# Patient Record
Sex: Male | Born: 1995 | Race: White | Hispanic: No | Marital: Single | Smoking: Never smoker
Health system: Southern US, Community
[De-identification: ages and names within clinical notes are randomized; demographics above are authoritative.]

## PROBLEM LIST (undated history)

## (undated) DIAGNOSIS — J45909 Unspecified asthma, uncomplicated: Secondary | ICD-10-CM

---

## 2017-03-21 ENCOUNTER — Encounter (HOSPITAL_BASED_OUTPATIENT_CLINIC_OR_DEPARTMENT_OTHER): Payer: Self-pay

## 2017-03-21 ENCOUNTER — Emergency Department (HOSPITAL_BASED_OUTPATIENT_CLINIC_OR_DEPARTMENT_OTHER)
Admission: EM | Admit: 2017-03-21 | Discharge: 2017-03-21 | Disposition: A | Discharge status: Home / Self Care | Payer: BLUE CROSS/BLUE SHIELD | Attending: Emergency Medicine | Admitting: Emergency Medicine

## 2017-03-21 ENCOUNTER — Emergency Department (HOSPITAL_BASED_OUTPATIENT_CLINIC_OR_DEPARTMENT_OTHER): Payer: BLUE CROSS/BLUE SHIELD

## 2017-03-21 ENCOUNTER — Other Ambulatory Visit: Payer: Self-pay

## 2017-03-21 DIAGNOSIS — R51 Headache: Secondary | ICD-10-CM | POA: Insufficient documentation

## 2017-03-21 DIAGNOSIS — R519 Headache, unspecified: Secondary | ICD-10-CM

## 2017-03-21 HISTORY — DX: Unspecified asthma, uncomplicated: J45.909

## 2017-03-21 LAB — CBC WITH DIFFERENTIAL/PLATELET
BASOS ABS: 0 10*3/uL (ref 0.0–0.1)
Basophils Relative: 0 %
Eosinophils Absolute: 0.6 10*3/uL (ref 0.0–0.7)
Eosinophils Relative: 7 %
HEMATOCRIT: 46.4 % (ref 39.0–52.0)
HEMOGLOBIN: 15.8 g/dL (ref 13.0–17.0)
LYMPHS ABS: 2.9 10*3/uL (ref 0.7–4.0)
LYMPHS PCT: 32 %
MCH: 28.4 pg (ref 26.0–34.0)
MCHC: 34.1 g/dL (ref 30.0–36.0)
MCV: 83.3 fL (ref 78.0–100.0)
Monocytes Absolute: 0.7 10*3/uL (ref 0.1–1.0)
Monocytes Relative: 8 %
NEUTROS ABS: 4.9 10*3/uL (ref 1.7–7.7)
Neutrophils Relative %: 53 %
Platelets: 304 10*3/uL (ref 150–400)
RBC: 5.57 MIL/uL (ref 4.22–5.81)
RDW: 13.4 % (ref 11.5–15.5)
WBC: 9.1 10*3/uL (ref 4.0–10.5)

## 2017-03-21 LAB — BASIC METABOLIC PANEL
ANION GAP: 10 (ref 5–15)
BUN: 14 mg/dL (ref 6–20)
CHLORIDE: 102 mmol/L (ref 101–111)
CO2: 25 mmol/L (ref 22–32)
Calcium: 9.3 mg/dL (ref 8.9–10.3)
Creatinine, Ser: 1.28 mg/dL — ABNORMAL HIGH (ref 0.61–1.24)
GFR calc Af Amer: >60 mL/min (ref >60)
GFR calc non Af Amer: >60 mL/min (ref >60)
GLUCOSE: 111 mg/dL — AB (ref 65–99)
POTASSIUM: 3.4 mmol/L — AB (ref 3.5–5.1)
Sodium: 137 mmol/L (ref 135–145)

## 2017-03-21 MED ORDER — KETOROLAC TROMETHAMINE 30 MG/ML IJ SOLN
30.0000 mg | Freq: Once | INTRAMUSCULAR | Status: AC
Start: 2017-03-21 — End: 2017-03-21
  Administered 2017-03-21: 30 mg via INTRAVENOUS
  Filled 2017-03-21: qty 1

## 2017-03-21 MED ORDER — IOPAMIDOL (ISOVUE-370) INJECTION 76%
100.0000 mL | Freq: Once | INTRAVENOUS | Status: AC | PRN
  Administered 2017-03-21: 80 mL via INTRAVENOUS

## 2017-03-21 MED ORDER — PROCHLORPERAZINE EDISYLATE 5 MG/ML IJ SOLN
10.0000 mg | Freq: Once | INTRAMUSCULAR | Status: AC
  Administered 2017-03-21: 10 mg via INTRAVENOUS
  Filled 2017-03-21: qty 2

## 2017-03-21 MED ORDER — DEXAMETHASONE SODIUM PHOSPHATE 10 MG/ML IJ SOLN
10.0000 mg | Freq: Once | INTRAMUSCULAR | Status: AC
Start: 2017-03-21 — End: 2017-03-21
  Administered 2017-03-21: 10 mg via INTRAVENOUS
  Filled 2017-03-21: qty 1

## 2017-03-21 MED ORDER — SODIUM CHLORIDE 0.9 % IV BOLUS (SEPSIS)
1000.0000 mL | Freq: Once | INTRAVENOUS | Status: AC
  Administered 2017-03-21: 1000 mL via INTRAVENOUS

## 2017-03-21 MED ORDER — DIPHENHYDRAMINE HCL 25 MG PO CAPS
25.0000 mg | ORAL_CAPSULE | Freq: Once | ORAL | Status: AC
  Administered 2017-03-21: 25 mg via ORAL
  Filled 2017-03-21: qty 1

## 2017-03-21 NOTE — ED Provider Notes (Signed)
I received this patient in signout from Dr. Blinda LeatherwoodPollina.  We were awaiting head imaging and improvement.  CT angios head was negative for acute process.  On reassessment, patient was sitting up and comfortable, stated that his headache had improved to 3 or 4 out of 10.  He felt comfortable going home.  Discussed supportive measures and extensively reviewed return precautions.   Joshua Webster, Ambrose Finlandachel Morgan, MD 03/21/17 [phone]
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
## 2017-03-21 NOTE — ED Triage Notes (Signed)
Pt reports sharp pain to the back of his head last night. Pt denies N/V, blurred vision, dizziness and hx of migraines.

## 2018-08-05 IMAGING — CT CT ANGIO HEAD
3 of 11 series · 16 of 47 positions shown · IV contrast (APPLIED)
Comparison: None.

CLINICAL DATA: Sharp posterior headache several days ago and again
last night. Headache, acute, severe, worst headache of life.

EXAM:
CT ANGIOGRAPHY HEAD
TECHNIQUE: Multidetector CT imaging of the head was performed using the
standard protocol during bolus administration of intravenous
contrast. Multiplanar CT image reconstructions and MIPs were
obtained to evaluate the vascular anatomy.
CONTRAST:  80mL BGXVL7-BMR IOPAMIDOL (BGXVL7-BMR) INJECTION 76%

[Series 15: ax thin · axial · 0.39mm/px · z∈[-193,-57]mm · 10 of 168 slices shown]
[im 16/168  brain]
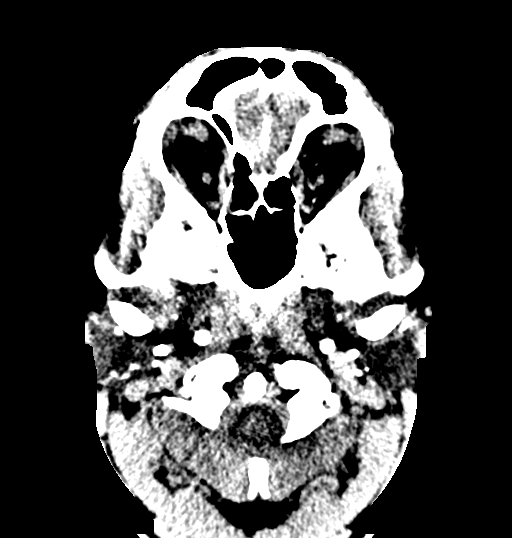
[im 31/168  bone]
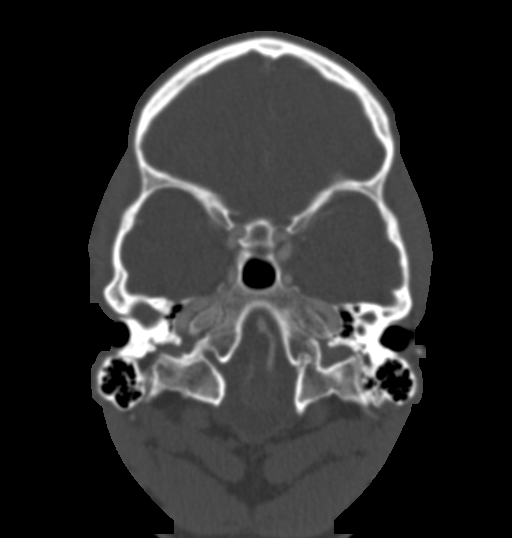
[im 46/168  brain]
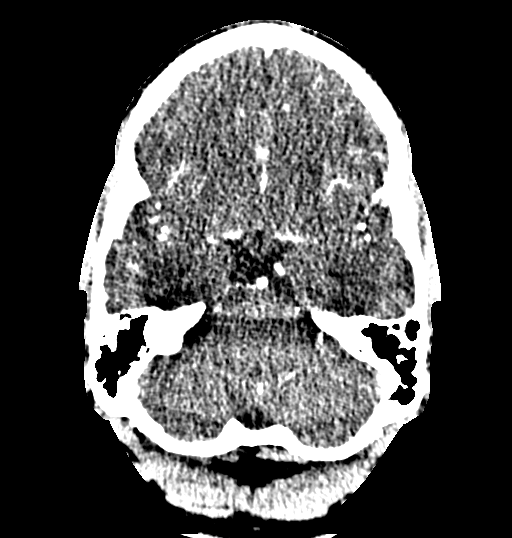
[im 61/168  bone]
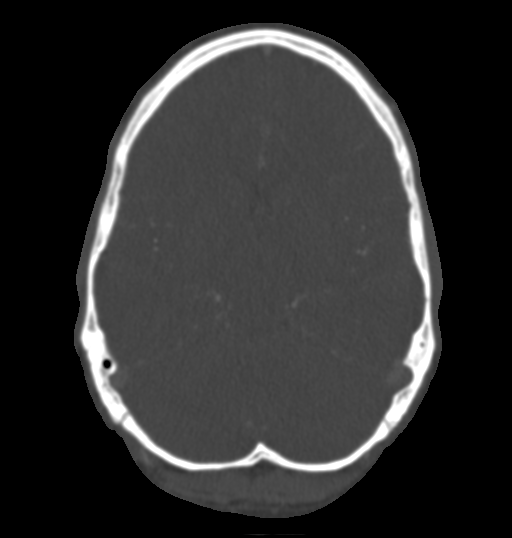
[im 76/168  brain]
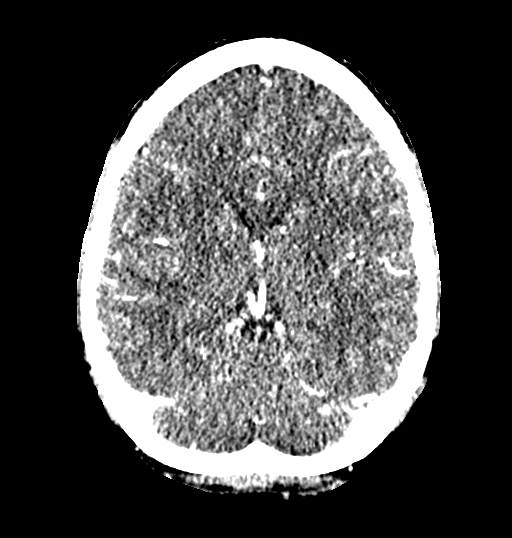
[im 92/168  bone]
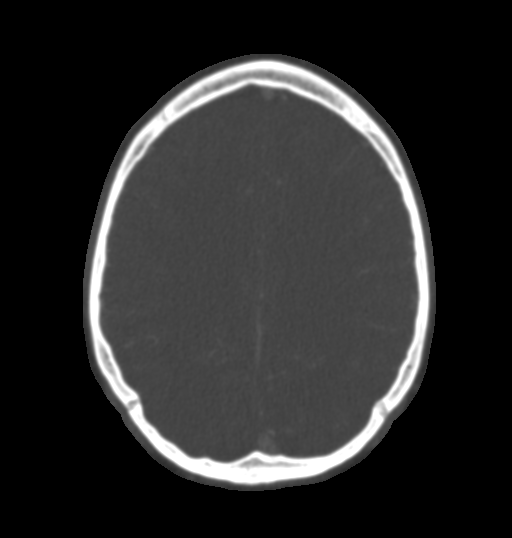
[im 107/168  brain]
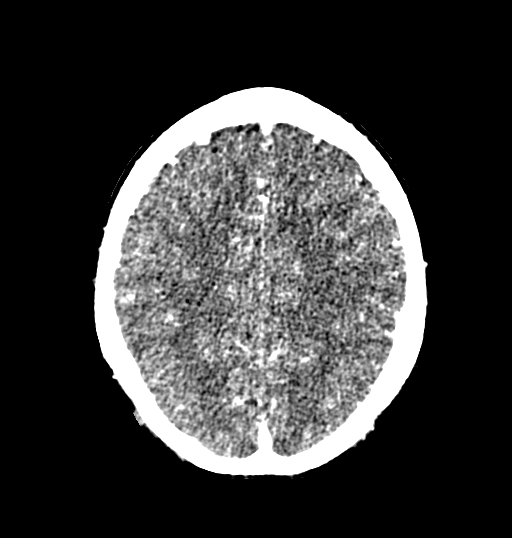
[im 122/168  bone]
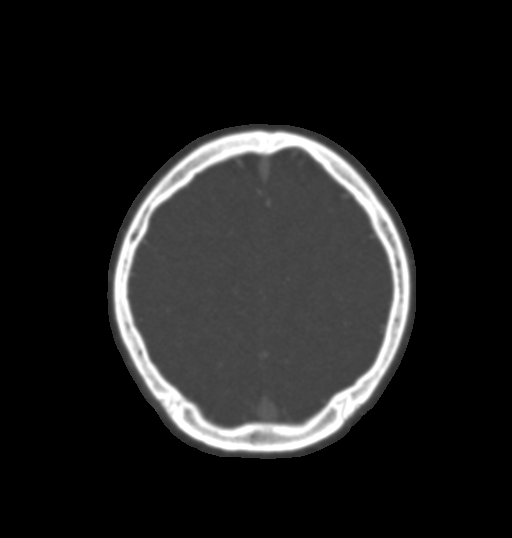
[im 137/168  brain]
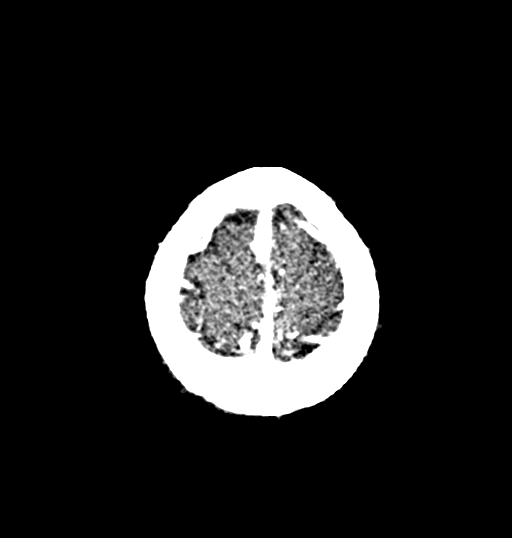
[im 152/168  bone]
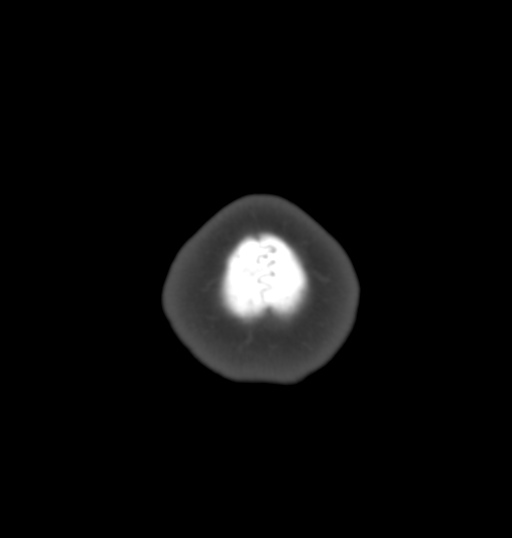

[Series 17: cor thin · coronal · 0.34mm/px · 3 of 209 slices shown]
[im 60/209  brain]
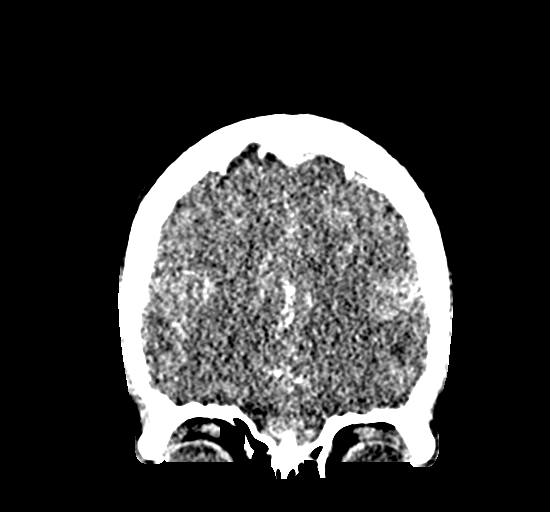
[im 90/209  brain]
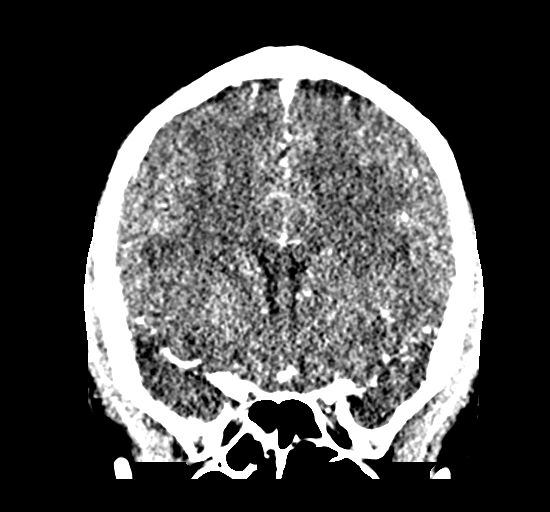
[im 119/209  brain]
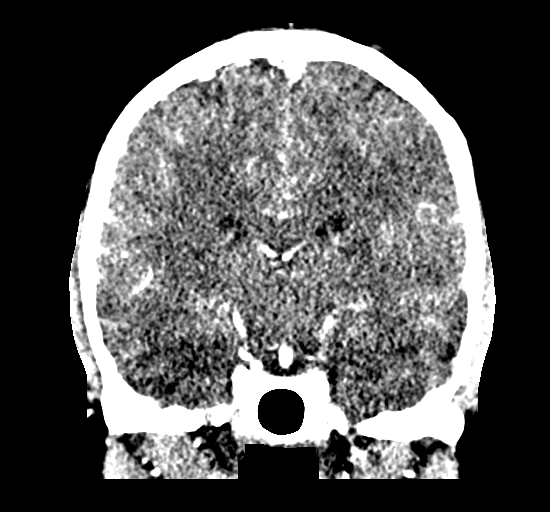

[Series 19: sag thin · sagittal · 0.34mm/px · 3 of 161 slices shown]
[im 33/161  brain]
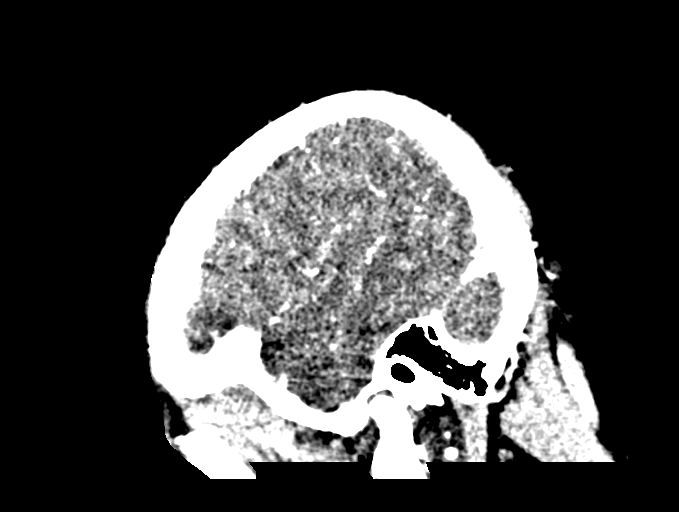
[im 65/161  brain]
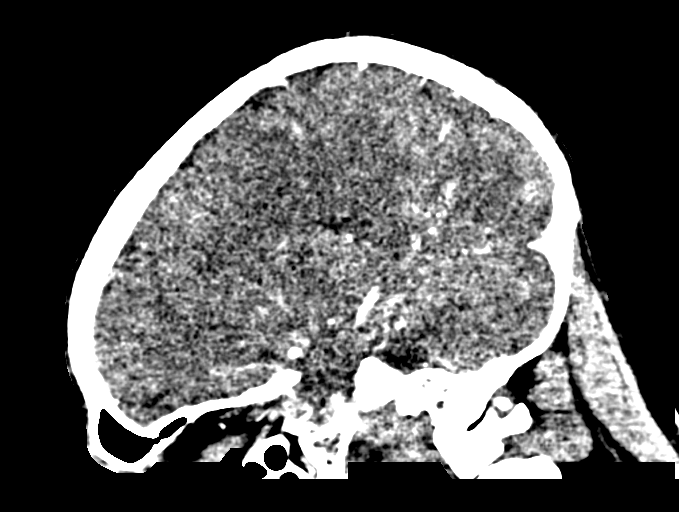
[im 97/161  brain]
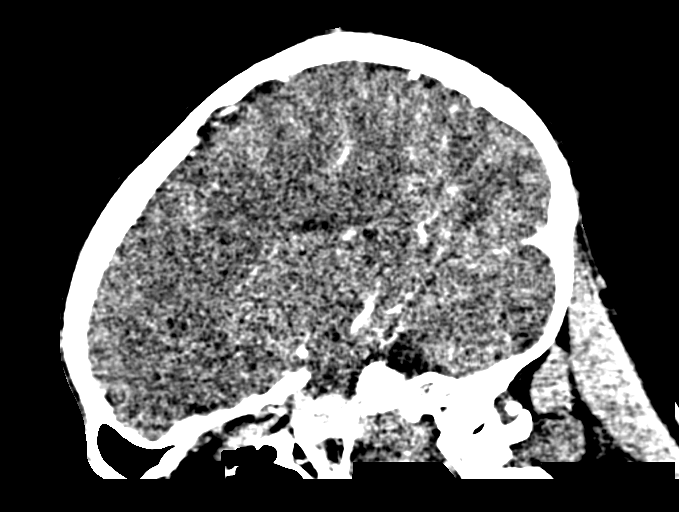

[16 of 47 positions shown; findings below may reference images not displayed]

FINDINGS: CT HEAD

Brain: The noncontrast images the brain demonstrate no acute
infarct, hemorrhage, or mass lesion. No significant extra-axial
fluid collection is present. No significant white matter disease is
present. The ventricles are of normal size.

Vascular: No hyperdense vessel or unexpected calcification.

Skull: Calvarium is intact. No focal lytic or blastic lesions are
present. No significant extracranial soft tissue injury is present.

Sinuses: The paranasal sinuses and mastoid air cells are clear.

Orbits: The visualized globes and orbits are within normal limits.

CTA HEAD

Anterior circulation: The internal carotid arteries are within
normal limits from the high cervical segments through the ICA
termini bilaterally. The A1 and M1 segments are normal. The anterior
communicating artery is patent. The MCA bifurcations are intact. ACA
and MCA branch vessels are within normal limits. There is no
significant proximal stenosis, aneurysm, or branch vessel occlusion.

Posterior circulation: The vertebral arteries are codominant. PICA
origins are visualized and normal. Basilar artery is normal. Both
posterior cerebral arteries originate from the basilar tip. A small
posterior communicating artery is identified on the left. PCA branch
vessels are within normal limits.

Venous sinuses: The dural sinuses are patent. The left transverse
sinus is dominant. The straight sinus and deep cerebral veins are
patent. Cortical veins are within normal limits.

Anatomic variants: None.

Delayed phase: Postcontrast images demonstrate no pathologic
enhancement.
IMPRESSION: Negative CTA circle of Willis without significant proximal stenosis,
aneurysm, or branch vessel occlusion.

No acute intracranial abnormality.  No acute infarct hemorrhage.
